# Patient Record
Sex: Male | Born: 1955 | Race: Black or African American | Hispanic: No | Marital: Married | Smoking: Current every day smoker
Health system: Southern US, Community
[De-identification: ages and names within clinical notes are randomized; demographics above are authoritative.]

## PROBLEM LIST (undated history)

## (undated) HISTORY — PX: CEREBRAL ANEURYSM REPAIR: SHX164

---

## 2016-01-29 ENCOUNTER — Ambulatory Visit: Payer: Self-pay | Admitting: Podiatry

## 2019-08-30 ENCOUNTER — Other Ambulatory Visit: Payer: Self-pay

## 2019-08-30 ENCOUNTER — Emergency Department (HOSPITAL_BASED_OUTPATIENT_CLINIC_OR_DEPARTMENT_OTHER)
Admission: EM | Admit: 2019-08-30 | Discharge: 2019-08-30 | Disposition: A | Discharge status: Home / Self Care | Payer: Self-pay | Attending: Emergency Medicine | Admitting: Emergency Medicine

## 2019-08-30 ENCOUNTER — Emergency Department (HOSPITAL_BASED_OUTPATIENT_CLINIC_OR_DEPARTMENT_OTHER): Payer: Self-pay

## 2019-08-30 ENCOUNTER — Encounter (HOSPITAL_BASED_OUTPATIENT_CLINIC_OR_DEPARTMENT_OTHER): Payer: Self-pay

## 2019-08-30 DIAGNOSIS — M25512 Pain in left shoulder: Secondary | ICD-10-CM | POA: Insufficient documentation

## 2019-08-30 DIAGNOSIS — F17210 Nicotine dependence, cigarettes, uncomplicated: Secondary | ICD-10-CM | POA: Insufficient documentation

## 2019-08-30 DIAGNOSIS — F1210 Cannabis abuse, uncomplicated: Secondary | ICD-10-CM | POA: Insufficient documentation

## 2019-08-30 LAB — BASIC METABOLIC PANEL
Anion gap: 12 (ref 5–15)
BUN: 10 mg/dL (ref 8–23)
CO2: 25 mmol/L (ref 22–32)
Calcium: 9.9 mg/dL (ref 8.9–10.3)
Chloride: 101 mmol/L (ref 98–111)
Creatinine, Ser: 0.66 mg/dL (ref 0.61–1.24)
GFR calc Af Amer: >60 mL/min (ref >60)
GFR calc non Af Amer: >60 mL/min (ref >60)
Glucose, Bld: 116 mg/dL — ABNORMAL HIGH (ref 70–99)
Potassium: 3.7 mmol/L (ref 3.5–5.1)
Sodium: 138 mmol/L (ref 135–145)

## 2019-08-30 LAB — CBC
HCT: 44.7 % (ref 39.0–52.0)
Hemoglobin: 14.9 g/dL (ref 13.0–17.0)
MCH: 29.2 pg (ref 26.0–34.0)
MCHC: 33.3 g/dL (ref 30.0–36.0)
MCV: 87.5 fL (ref 80.0–100.0)
Platelets: 263 10*3/uL (ref 150–400)
RBC: 5.11 MIL/uL (ref 4.22–5.81)
RDW: 14.7 % (ref 11.5–15.5)
WBC: 7.3 10*3/uL (ref 4.0–10.5)
nRBC: 0 % (ref 0.0–0.2)

## 2019-08-30 LAB — TROPONIN I (HIGH SENSITIVITY): Troponin I (High Sensitivity): 8 ng/L (ref <18)

## 2019-08-30 MED ORDER — HYDROCODONE-ACETAMINOPHEN 5-325 MG PO TABS: 1.0000 | ORAL_TABLET | Freq: Four times a day (QID) | ORAL | 0 refills | Status: AC | PRN

## 2019-08-30 MED ORDER — SODIUM CHLORIDE 0.9% FLUSH
3.0000 mL | Freq: Once | INTRAVENOUS | Status: DC
  Filled 2019-08-30: qty 3

## 2019-08-30 MED ORDER — HYDROCODONE-ACETAMINOPHEN 5-325 MG PO TABS
1.0000 | ORAL_TABLET | Freq: Once | ORAL | Status: AC
  Administered 2019-08-30: 1 via ORAL
  Filled 2019-08-30: qty 1

## 2019-08-30 NOTE — Discharge Instructions (Signed)
Take the hydrocodone as needed for pain.  Watch for development of a rash if a rash develops in the area this is probably shingles.  And will be important to return for antiviral medication.  Otherwise this seems to be musculoskeletal in nature.  Referral information provided to sports medicine upstairs.  For follow-up.

## 2019-08-30 NOTE — ED Provider Notes (Signed)
[facility] EMERGENCY DEPARTMENT Provider Note   CSN: [id number] Arrival date & time: 08/30/19  2018     History Chief Complaint  Patient presents with  . Shoulder Pain  . Back Pain    Andrew Summers is a 64 y.o. male.  Patient with a complaint of posterior left shoulder pain for 3 days.  Patient was using the arm but no injury to it.  Patient states there is some itching to it.  But no rashes develop.  Patient did have chickenpox in the past never had shingles before.  No anterior chest pain.  That shoulder pain is worse with movement.  But it is in more of the upper trapezius area right above the shoulder blade not in the actual shoulder itself.        History reviewed. No pertinent past medical history.  There are no problems to display for this patient.   Past Surgical History:  Procedure Laterality Date  . CEREBRAL ANEURYSM REPAIR         No family history on file.  Social History   Tobacco Use  . Smoking status: Current Every Day Smoker    Types: Cigarettes  . Smokeless tobacco: Never Used  Vaping Use  . Vaping Use: Never used  Substance Use Topics  . Alcohol use: Yes    Comment: occ  . Drug use: Yes    Types: Marijuana    Home Medications Prior to Admission medications   Medication Sig Start Date End Date Taking? Authorizing Provider  HYDROcodone-acetaminophen (NORCO/VICODIN) 5-325 MG tablet Take 1 tablet by mouth every 6 (six) hours as needed for moderate pain. 08/30/19   Vanetta Mulders, MD    Allergies    Patient has no known allergies.  Review of Systems   Review of Systems  Constitutional: Negative for chills and fever.  HENT: Negative for congestion, rhinorrhea and sore throat.   Eyes: Negative for visual disturbance.  Respiratory: Negative for cough and shortness of breath.   Cardiovascular: Negative for chest pain and leg swelling.  Gastrointestinal: Negative for abdominal pain, diarrhea, nausea and vomiting.  Genitourinary:  Negative for dysuria.  Musculoskeletal: Negative for back pain and neck pain.  Skin: Negative for rash.  Neurological: Negative for dizziness, light-headedness and headaches.  Hematological: Does not bruise/bleed easily.  Psychiatric/Behavioral: Negative for confusion.    Physical Exam Updated Vital Signs BP (!) 143/84 (BP Location: Right Arm)   Pulse 61   Temp 99 F (37.2 C) (Oral)   Resp 19   SpO2 100%   Physical Exam Vitals and nursing note reviewed.  Constitutional:      Appearance: Normal appearance. He is well-developed.  HENT:     Head: Normocephalic and atraumatic.  Eyes:     Extraocular Movements: Extraocular movements intact.     Conjunctiva/sclera: Conjunctivae normal.     Pupils: Pupils are equal, round, and reactive to light.  Cardiovascular:     Rate and Rhythm: Normal rate and regular rhythm.     Heart sounds: No murmur heard.   Pulmonary:     Effort: Pulmonary effort is normal. No respiratory distress.     Breath sounds: Normal breath sounds.  Chest:     Chest wall: No tenderness.  Abdominal:     Palpations: Abdomen is soft.     Tenderness: There is no abdominal tenderness.  Musculoskeletal:        General: Normal range of motion.     Cervical back: Normal range of motion and  neck supple.  Skin:    General: Skin is warm and dry.     Capillary Refill: Capillary refill takes less than 2 seconds.     Findings: No rash.     Comments: For upper trapezius area with some erythema and a little bit of skin excoriation but no distinct rash.  No pain with movement at the elbow or shoulder or wrist.  But movement of the arm does increase the pain around the trapezius area.  Distally radial pulses 2+ neurovascularly intact good cap refill.  Neurological:     General: No focal deficit present.     Mental Status: He is alert and oriented to person, place, and time.     Cranial Nerves: No cranial nerve deficit.     Sensory: No sensory deficit.     Motor: No  weakness.     ED Results / Procedures / Treatments   Labs (all labs ordered are listed, but only abnormal results are displayed) Labs Reviewed  BASIC METABOLIC PANEL - Abnormal; Notable for the following components:      Result Value   Glucose, Bld 116 (*)    All other components within normal limits  CBC  TROPONIN I (HIGH SENSITIVITY)  TROPONIN I (HIGH SENSITIVITY)    EKG EKG Interpretation  Date/Time:  Tuesday August 30 2019 20:27:04 EDT Ventricular Rate:  81 PR Interval:  184 QRS Duration: 80 QT Interval:  368 QTC Calculation: 427 R Axis:   -16 Text Interpretation: Normal sinus rhythm Biatrial enlargement Cannot rule out Anterior infarct , age undetermined Abnormal ECG No previous ECGs available Confirmed by Vanetta Mulders [phone]) on 08/30/2019 9:54:42 PM   Radiology DG Chest 2 View  Result Date: 08/30/2019 CLINICAL DATA:  Chest pain for 3 days. EXAM: CHEST - 2 VIEW COMPARISON:  None. FINDINGS: The cardiomediastinal contours are normal. Aortic atherosclerosis. Pulmonary vasculature is normal. No consolidation, pleural effusion, or pneumothorax. No acute osseous abnormalities are seen. Broad-based scoliotic curvature of the thoracic spine. IMPRESSION: No acute chest findings. Electronically Signed   By: Narda Rutherford M.D.   On: 08/30/2019 20:46    Procedures Procedures (including critical care time)  Medications Ordered in ED Medications  sodium chloride flush (NS) 0.9 % injection 3 mL (3 mLs Intravenous Not Given 08/30/19 2159)  HYDROcodone-acetaminophen (NORCO/VICODIN) 5-325 MG per tablet 1 tablet (1 tablet Oral Given 08/30/19 2257)    ED Course  I have reviewed the triage vital signs and the nursing notes.  Pertinent labs & imaging results that were available during my care of the patient were reviewed by me and considered in my medical decision making (see chart for details).    MDM Rules/Calculators/A&P                          Feel patient's symptoms are not  cardiac in nature.  Could be prodrome to shingles.  Since the pains just been there for 3 days.  If rash develops they will return for antiviral treatment.  Not consistent with cardiac type pain at all.  This seems to be very musculoskeletal.  Will  treat with hydrocodone give referral to sports medicine. Final Clinical Impression(s) / ED Diagnoses Final diagnoses:  Acute pain of left shoulder    Rx / DC Orders ED Discharge Orders         Ordered    HYDROcodone-acetaminophen (NORCO/VICODIN) 5-325 MG tablet  Every 6 hours PRN     Discontinue  Reprint  08/30/19 2322           Vanetta Mulders, MD 08/30/19 [phone]

## 2019-08-30 NOTE — ED Triage Notes (Signed)
Pt c/o CP x 3 days-denies fever/flu sx-NAD-to triage in w/c

## 2021-08-29 IMAGING — CR DG CHEST 2V
2 series · 2 of 2 positions shown · non-contrast
Comparison: None.

CLINICAL DATA: Chest pain for 3 days.

EXAM:
CHEST - 2 VIEW

[w chest pa]
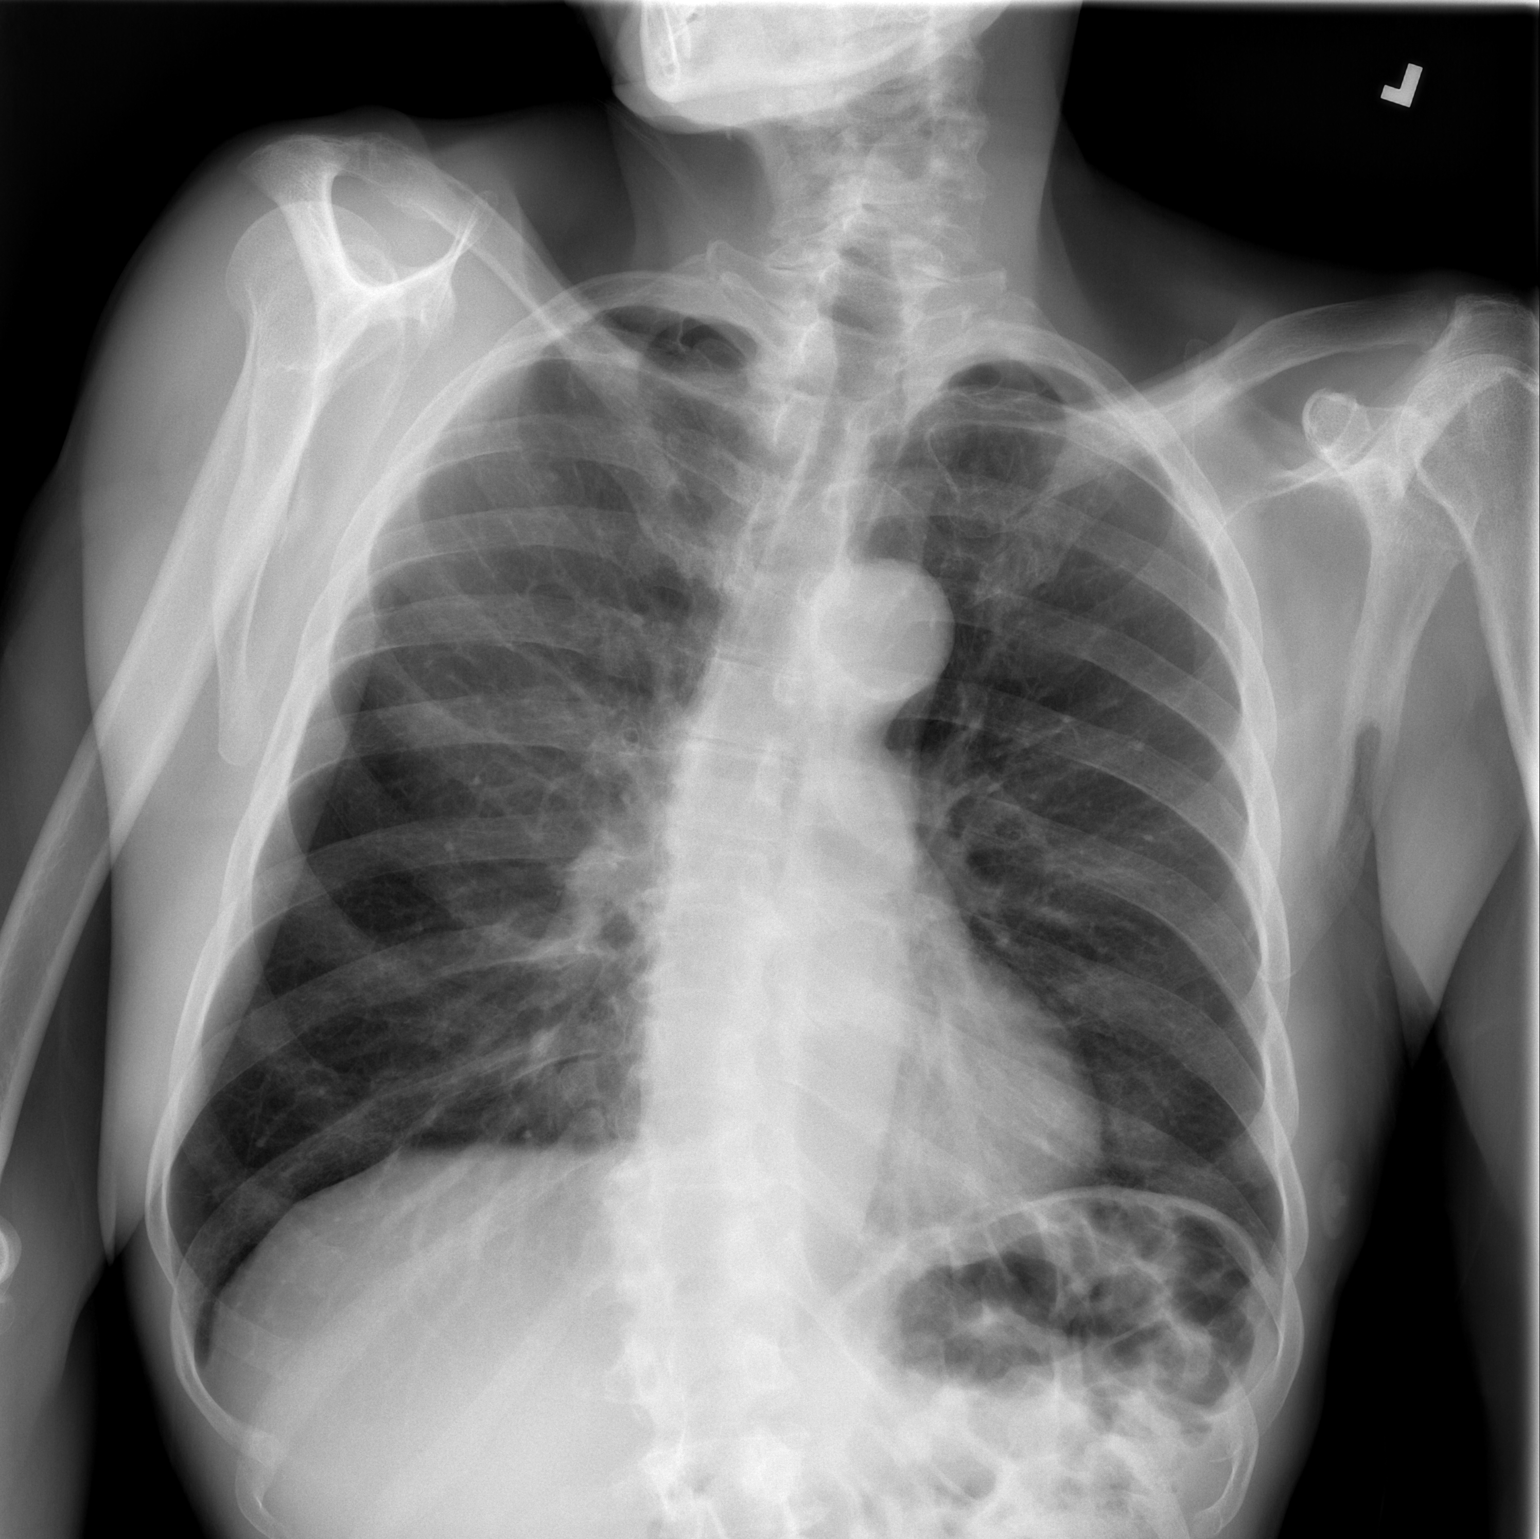

[w chest lat]
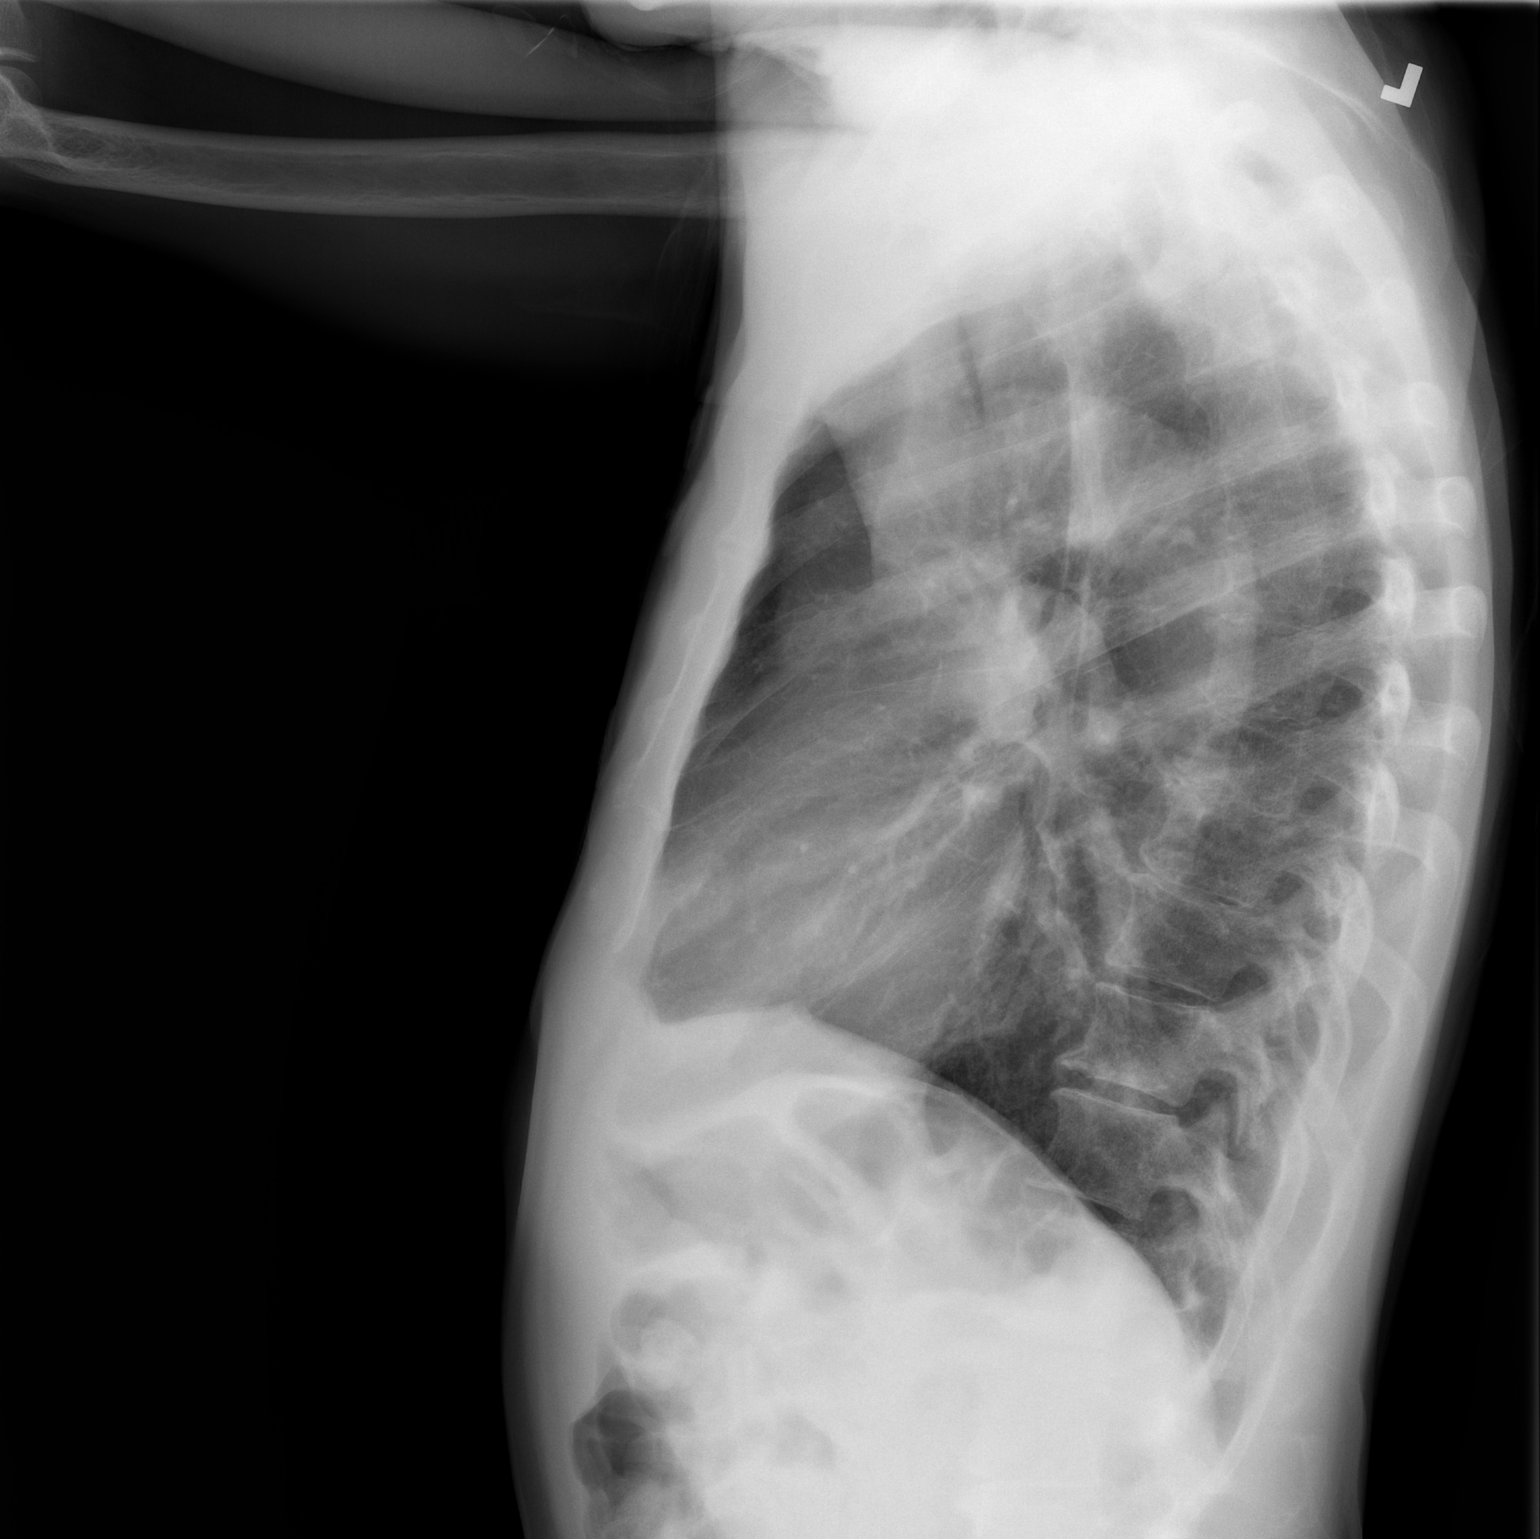

[2 of 2 positions shown; findings below may reference images not displayed]

FINDINGS: The cardiomediastinal contours are normal. Aortic atherosclerosis.
Pulmonary vasculature is normal. No consolidation, pleural effusion,
or pneumothorax. No acute osseous abnormalities are seen.
Broad-based scoliotic curvature of the thoracic spine.
IMPRESSION: No acute chest findings.
